# Patient Record
Sex: Female | Born: 1935 | Race: White | Hispanic: No | Marital: Married | Smoking: Never smoker
Health system: Southern US, Community
[De-identification: ages and names within clinical notes are randomized; demographics above are authoritative.]

## PROBLEM LIST (undated history)

## (undated) DIAGNOSIS — I10 Essential (primary) hypertension: Secondary | ICD-10-CM

## (undated) HISTORY — PX: REPLACEMENT TOTAL KNEE BILATERAL: SUR1225

## (undated) HISTORY — PX: CHOLECYSTECTOMY: SHX55

## (undated) HISTORY — PX: ABDOMINAL HYSTERECTOMY: SHX81

## (undated) HISTORY — PX: APPENDECTOMY: SHX54

---

## 2017-01-15 ENCOUNTER — Observation Stay (HOSPITAL_BASED_OUTPATIENT_CLINIC_OR_DEPARTMENT_OTHER)
Admission: EM | Admit: 2017-01-15 | Discharge: 2017-01-16 | Disposition: A | Discharge status: Home / Self Care | Payer: MEDICARE | Attending: Internal Medicine | Admitting: Internal Medicine

## 2017-01-15 ENCOUNTER — Encounter (HOSPITAL_BASED_OUTPATIENT_CLINIC_OR_DEPARTMENT_OTHER): Payer: Self-pay

## 2017-01-15 DIAGNOSIS — R112 Nausea with vomiting, unspecified: Secondary | ICD-10-CM | POA: Diagnosis not present

## 2017-01-15 DIAGNOSIS — R0902 Hypoxemia: Secondary | ICD-10-CM | POA: Diagnosis present

## 2017-01-15 DIAGNOSIS — Z79899 Other long term (current) drug therapy: Secondary | ICD-10-CM | POA: Diagnosis not present

## 2017-01-15 DIAGNOSIS — R1030 Lower abdominal pain, unspecified: Secondary | ICD-10-CM | POA: Diagnosis not present

## 2017-01-15 DIAGNOSIS — I10 Essential (primary) hypertension: Secondary | ICD-10-CM | POA: Diagnosis present

## 2017-01-15 DIAGNOSIS — D72829 Elevated white blood cell count, unspecified: Secondary | ICD-10-CM | POA: Diagnosis present

## 2017-01-15 DIAGNOSIS — F419 Anxiety disorder, unspecified: Secondary | ICD-10-CM | POA: Diagnosis present

## 2017-01-15 DIAGNOSIS — Z7982 Long term (current) use of aspirin: Secondary | ICD-10-CM | POA: Insufficient documentation

## 2017-01-15 DIAGNOSIS — N289 Disorder of kidney and ureter, unspecified: Secondary | ICD-10-CM

## 2017-01-15 DIAGNOSIS — K625 Hemorrhage of anus and rectum: Principal | ICD-10-CM | POA: Diagnosis present

## 2017-01-15 DIAGNOSIS — N390 Urinary tract infection, site not specified: Secondary | ICD-10-CM | POA: Diagnosis present

## 2017-01-15 DIAGNOSIS — K219 Gastro-esophageal reflux disease without esophagitis: Secondary | ICD-10-CM | POA: Diagnosis present

## 2017-01-15 DIAGNOSIS — I499 Cardiac arrhythmia, unspecified: Secondary | ICD-10-CM | POA: Insufficient documentation

## 2017-01-15 HISTORY — DX: Essential (primary) hypertension: I10

## 2017-01-15 LAB — COMPREHENSIVE METABOLIC PANEL
ALBUMIN: 4 g/dL (ref 3.5–5.0)
ALT: 32 U/L (ref 14–54)
ANION GAP: 14 (ref 5–15)
AST: 39 U/L (ref 15–41)
Alkaline Phosphatase: 61 U/L (ref 38–126)
BILIRUBIN TOTAL: 0.9 mg/dL (ref 0.3–1.2)
BUN: 27 mg/dL — ABNORMAL HIGH (ref 6–20)
CALCIUM: 9 mg/dL (ref 8.9–10.3)
CHLORIDE: 98 mmol/L — AB (ref 101–111)
CO2: 23 mmol/L (ref 22–32)
Creatinine, Ser: 1.36 mg/dL — ABNORMAL HIGH (ref 0.44–1.00)
GFR calc Af Amer: 41 mL/min — ABNORMAL LOW (ref >60)
GFR, EST NON AFRICAN AMERICAN: 35 mL/min — AB (ref >60)
GLUCOSE: 138 mg/dL — AB (ref 65–99)
POTASSIUM: 4.6 mmol/L (ref 3.5–5.1)
Sodium: 135 mmol/L (ref 135–145)
TOTAL PROTEIN: 7.7 g/dL (ref 6.5–8.1)

## 2017-01-15 LAB — CBC
HCT: 40.8 % (ref 36.0–46.0)
Hemoglobin: 13.8 g/dL (ref 12.0–15.0)
MCH: 30 pg (ref 26.0–34.0)
MCHC: 33.8 g/dL (ref 30.0–36.0)
MCV: 88.7 fL (ref 78.0–100.0)
PLATELETS: 242 10*3/uL (ref 150–400)
RBC: 4.6 MIL/uL (ref 3.87–5.11)
RDW: 13.7 % (ref 11.5–15.5)
WBC: 12.4 10*3/uL — ABNORMAL HIGH (ref 4.0–10.5)

## 2017-01-15 MED ORDER — LORAZEPAM 1 MG PO TABS
1.0000 mg | ORAL_TABLET | Freq: Once | ORAL | Status: AC
  Administered 2017-01-15: 1 mg via ORAL
  Filled 2017-01-15: qty 1

## 2017-01-15 NOTE — ED Provider Notes (Signed)
[facility] Provider Note   CSN: [id number] Arrival date & time: 01/15/17  1926  By signing my name below, I, Nelwyn Salisbury, attest that this documentation has been prepared under the direction and in the presence of Rolan Bucco, MD . Electronically Signed: Nelwyn Salisbury, Scribe. 01/15/2017. 8:38 PM.  History   Chief Complaint Chief Complaint  Patient presents with  . Rectal Bleeding   The history is provided by the patient. No language interpreter was used.     HPI Comments: Vanessa Levine is a 83 y.o. female with pmhx of HTN who presents to the Emergency Department complaining of sudden-onset, constant rectal bleeding beginning this morning. She describes her rectal bleeding as occurring 4-6 times today. Pt reports associated nausea, vomiting, abdominal cramping and fatigue. She has not tried anything PTA for her symptoms. Denies any dizziness, syncope, or rectal pain. Pt has been compliant with a daily baby aspirin as her only blood-thinning medication. She has a previous abdominal shx of cholecystectomy, appendectomy and hysterectomy.   Past Medical History:  Diagnosis Date  . Hypertension     Patient Active Problem List   Diagnosis Date Noted  . Rectal bleeding 01/15/2017    Past Surgical History:  Procedure Laterality Date  . ABDOMINAL HYSTERECTOMY    . APPENDECTOMY    . CHOLECYSTECTOMY    . REPLACEMENT TOTAL KNEE BILATERAL      OB History    No data available       Home Medications    Prior to Admission medications   Not on File    Family History No family history on file.  Social History Social History  Substance Use Topics  . Smoking status: Not on file  . Smokeless tobacco: Never Used  . Alcohol use No     Allergies   Patient has no known allergies.   Review of Systems Review of Systems  Constitutional: Positive for fatigue. Negative for chills, diaphoresis and fever.  HENT: Negative for congestion, rhinorrhea and sneezing.     Eyes: Negative.   Respiratory: Negative for cough, chest tightness and shortness of breath.   Cardiovascular: Negative for chest pain and leg swelling.  Gastrointestinal: Positive for anal bleeding, nausea and vomiting. Negative for abdominal pain, blood in stool, diarrhea and rectal pain.  Genitourinary: Negative for difficulty urinating, flank pain, frequency and hematuria.  Musculoskeletal: Negative for arthralgias and back pain.  Skin: Negative for rash.  Neurological: Negative for dizziness, speech difficulty, weakness, numbness and headaches.     Physical Exam Updated Vital Signs BP (!) 142/75 (BP Location: Right Arm)   Pulse 64   Temp 98.4 F (36.9 C) (Oral)   Resp 18   Ht  (1.702 m)   Wt 260 lb (117.9 kg)   SpO2 92%   BMI 40.72 kg/m   Physical Exam  Constitutional: She is oriented to person, place, and time. She appears well-developed and well-nourished.  HENT:  Head: Normocephalic and atraumatic.  Eyes: Pupils are equal, round, and reactive to light.  Neck: Normal range of motion. Neck supple.  Cardiovascular: Normal rate, regular rhythm and normal heart sounds.   Pulmonary/Chest: Effort normal and breath sounds normal. No respiratory distress. She has no wheezes. She has no rales. She exhibits no tenderness.  Abdominal: Soft. Bowel sounds are normal. There is no tenderness. There is no rebound and no guarding.  Genitourinary:  Genitourinary Comments: Large amount of blood in patient's diaper. No obvious source for bleeding.   Musculoskeletal: Normal range  of motion. She exhibits no edema.  Lymphadenopathy:    She has no cervical adenopathy.  Neurological: She is alert and oriented to person, place, and time.  Skin: Skin is warm and dry. No rash noted.  Psychiatric: She has a normal mood and affect.     ED Treatments / Results  DIAGNOSTIC STUDIES:  Oxygen Saturation is 96% on RA, normal by my interpretation.    COORDINATION OF CARE:  8:52 PM  Discussed treatment plan with pt at bedside which includes admission and pt is hesitant about agreeing to admission.   9:31 PM Spoke with patient again about admission to the hospital and she agreed to the plan.   10:13 PM Consulted with Dr. Antionette Char, Hospitalist, about pt admission to [facility].  Labs (all labs ordered are listed, but only abnormal results are displayed) Labs Reviewed  COMPREHENSIVE METABOLIC PANEL - Abnormal; Notable for the following:       Result Value   Chloride 98 (*)    Glucose, Bld 138 (*)    BUN 27 (*)    Creatinine, Ser 1.36 (*)    GFR calc non Af Amer 35 (*)    GFR calc Af Amer 41 (*)    All other components within normal limits  CBC - Abnormal; Notable for the following:    WBC 12.4 (*)    All other components within normal limits    EKG  EKG Interpretation None       Radiology No results found.  Procedures Procedures (including critical care time)  Medications Ordered in ED Medications  LORazepam (ATIVAN) tablet 1 mg (1 mg Oral Given 01/15/17 2113)     Initial Impression / Assessment and Plan / ED Course  I have reviewed the triage vital signs and the nursing notes.  Pertinent labs & imaging results that were available during my care of the patient were reviewed by me and considered in my medical decision making (see chart for details).     PT with rectal bleeding, no abd pain.  No indication for CT.  BP stable, Hgb stable.  Will admit to [facility].  Final Clinical Impressions(s) / ED Diagnoses   Final diagnoses:  Rectal bleeding    New Prescriptions New Prescriptions   No medications on file  I personally performed the services described in this documentation, which was scribed in my presence.  The recorded information has been reviewed and considered.     Rolan Bucco, MD 01/15/17 2223

## 2017-01-15 NOTE — Plan of Care (Signed)
Accepted to telemetry/observation for hematochezia. She will need serial H&H measurements and GI evaluation in the morning.  83 year old female with a past medical history hypertension who presented to the emergency department at [facility] complains of abdominal pain, nausea, 1 episode of emesis and 6 episodes of bloody diarrhea since earlier today. Not on aspirin for anticoagulation.  Per Dr. Fredderick Phenix,  Chief Complaint    Chief Complaint  Patient presents with  . Rectal Bleeding   The history is provided by the patient. No language interpreter was used.     HPI Comments: Vanessa Levine is a 83 y.o. female with pmhx of HTN who presents to the Emergency Department complaining of sudden-onset, constant rectal bleeding beginning this morning. She describes her rectal bleeding as occurring 4-6 times today. Pt reports associated nausea, vomiting, abdominal cramping and fatigue. She has not tried anything PTA for her symptoms. Denies any dizziness, syncope, or rectal pain. Pt has been compliant with a daily baby aspirin as her only blood-thinning medication. She has a previous abdominal shx of cholecystectomy, appendectomy and hysterectomy.     Comprehensive metabolic panel [[id number]] (Abnormal) Collected: 01/15/17 1956  Updated: 01/15/17 2022   Specimen Type: Blood   Specimen Source: Vein    Sodium 135 mmol/L   Potassium 4.6 mmol/L   Chloride 98 (L) mmol/L   CO2 23 mmol/L   Glucose, Bld 138 (H) mg/dL   BUN 27 (H) mg/dL   Creatinine, Ser 7.82 (H) mg/dL   Calcium 9.0 mg/dL   Total Protein 7.7 g/dL   Albumin 4.0 g/dL   AST 39 U/L   ALT 32 U/L   Alkaline Phosphatase 61 U/L   Total Bilirubin 0.9 mg/dL   GFR calc non Af Amer 35 (L) mL/min   GFR calc Af Amer 41 (L) mL/min   Anion gap 14  CBC [[id number]] (Abnormal) Collected: 01/15/17 1956  Updated: 01/15/17 2007   Specimen Type: Blood   Specimen Source: Vein    WBC 12.4 (H) K/uL   RBC 4.60 MIL/uL   Hemoglobin 13.8 g/dL   HCT 57.8 %   MCV 46.9 fL   MCH 30.0 pg   MCHC 33.8 g/dL   RDW 62.9 %   Platelets 242 K/uL   Sanda Klein, M.D. Pager [phone]

## 2017-01-15 NOTE — ED Notes (Signed)
PT presents to ED with c/o of abdominal pain and bloody diarrhea X at least 6 per husband and vomiting X 1 today.

## 2017-01-15 NOTE — ED Notes (Signed)
Pt up to bedside potty chair with assistance .

## 2017-01-16 ENCOUNTER — Observation Stay (HOSPITAL_COMMUNITY): Payer: MEDICARE

## 2017-01-16 ENCOUNTER — Other Ambulatory Visit: Payer: Self-pay

## 2017-01-16 ENCOUNTER — Encounter (HOSPITAL_COMMUNITY): Payer: Self-pay

## 2017-01-16 DIAGNOSIS — D72829 Elevated white blood cell count, unspecified: Secondary | ICD-10-CM | POA: Diagnosis present

## 2017-01-16 DIAGNOSIS — N390 Urinary tract infection, site not specified: Secondary | ICD-10-CM

## 2017-01-16 DIAGNOSIS — K219 Gastro-esophageal reflux disease without esophagitis: Secondary | ICD-10-CM

## 2017-01-16 DIAGNOSIS — N289 Disorder of kidney and ureter, unspecified: Secondary | ICD-10-CM

## 2017-01-16 DIAGNOSIS — I10 Essential (primary) hypertension: Secondary | ICD-10-CM | POA: Diagnosis present

## 2017-01-16 DIAGNOSIS — I499 Cardiac arrhythmia, unspecified: Secondary | ICD-10-CM

## 2017-01-16 DIAGNOSIS — R1030 Lower abdominal pain, unspecified: Secondary | ICD-10-CM | POA: Diagnosis not present

## 2017-01-16 DIAGNOSIS — F419 Anxiety disorder, unspecified: Secondary | ICD-10-CM | POA: Diagnosis present

## 2017-01-16 DIAGNOSIS — K625 Hemorrhage of anus and rectum: Secondary | ICD-10-CM

## 2017-01-16 DIAGNOSIS — R0902 Hypoxemia: Secondary | ICD-10-CM | POA: Diagnosis present

## 2017-01-16 DIAGNOSIS — R112 Nausea with vomiting, unspecified: Secondary | ICD-10-CM | POA: Diagnosis not present

## 2017-01-16 LAB — BASIC METABOLIC PANEL
Anion gap: 8 (ref 5–15)
BUN: 24 mg/dL — AB (ref 6–20)
CHLORIDE: 103 mmol/L (ref 101–111)
CO2: 29 mmol/L (ref 22–32)
Calcium: 8.5 mg/dL — ABNORMAL LOW (ref 8.9–10.3)
Creatinine, Ser: 1.12 mg/dL — ABNORMAL HIGH (ref 0.44–1.00)
GFR calc Af Amer: 52 mL/min — ABNORMAL LOW (ref >60)
GFR calc non Af Amer: 44 mL/min — ABNORMAL LOW (ref >60)
GLUCOSE: 116 mg/dL — AB (ref 65–99)
POTASSIUM: 4.2 mmol/L (ref 3.5–5.1)
Sodium: 140 mmol/L (ref 135–145)

## 2017-01-16 LAB — CBC WITH DIFFERENTIAL/PLATELET
BASOS ABS: 0 10*3/uL (ref 0.0–0.1)
Basophils Relative: 1 %
EOS PCT: 2 %
Eosinophils Absolute: 0.1 10*3/uL (ref 0.0–0.7)
HCT: 36.5 % (ref 36.0–46.0)
HEMOGLOBIN: 12 g/dL (ref 12.0–15.0)
LYMPHS PCT: 28 %
Lymphs Abs: 2.3 10*3/uL (ref 0.7–4.0)
MCH: 29.6 pg (ref 26.0–34.0)
MCHC: 32.9 g/dL (ref 30.0–36.0)
MCV: 89.9 fL (ref 78.0–100.0)
Monocytes Absolute: 0.7 10*3/uL (ref 0.1–1.0)
Monocytes Relative: 9 %
NEUTROS PCT: 60 %
Neutro Abs: 5 10*3/uL (ref 1.7–7.7)
PLATELETS: 214 10*3/uL (ref 150–400)
RBC: 4.06 MIL/uL (ref 3.87–5.11)
RDW: 13.8 % (ref 11.5–15.5)
WBC: 8.2 10*3/uL (ref 4.0–10.5)

## 2017-01-16 LAB — HEMOGLOBIN AND HEMATOCRIT, BLOOD
HCT: 38.4 % (ref 36.0–46.0)
HEMATOCRIT: 39.7 % (ref 36.0–46.0)
HEMOGLOBIN: 12.5 g/dL (ref 12.0–15.0)
Hemoglobin: 13.2 g/dL (ref 12.0–15.0)

## 2017-01-16 LAB — ABO/RH: ABO/RH(D): A POS

## 2017-01-16 LAB — URINALYSIS, ROUTINE W REFLEX MICROSCOPIC
Bilirubin Urine: NEGATIVE
Glucose, UA: NEGATIVE mg/dL
Ketones, ur: NEGATIVE mg/dL
NITRITE: NEGATIVE
PROTEIN: NEGATIVE mg/dL
Specific Gravity, Urine: 1.015 (ref 1.005–1.030)
pH: 5 (ref 5.0–8.0)

## 2017-01-16 LAB — TYPE AND SCREEN
ABO/RH(D): A POS
ANTIBODY SCREEN: NEGATIVE

## 2017-01-16 LAB — MAGNESIUM: Magnesium: 1.9 mg/dL (ref 1.7–2.4)

## 2017-01-16 LAB — TROPONIN I

## 2017-01-16 LAB — TSH: TSH: 1.247 u[IU]/mL (ref 0.350–4.500)

## 2017-01-16 MED ORDER — SODIUM CHLORIDE 0.9 % IV BOLUS (SEPSIS)
250.0000 mL | Freq: Once | INTRAVENOUS | Status: AC
  Administered 2017-01-16: 250 mL via INTRAVENOUS

## 2017-01-16 MED ORDER — ACETAMINOPHEN 325 MG PO TABS: 650.0000 mg | ORAL_TABLET | Freq: Four times a day (QID) | ORAL | Status: DC | PRN

## 2017-01-16 MED ORDER — MORPHINE SULFATE (PF) 4 MG/ML IV SOLN
INTRAVENOUS | Status: AC
  Administered 2017-01-16: 2 mg via INTRAVENOUS
  Filled 2017-01-16: qty 1

## 2017-01-16 MED ORDER — PANTOPRAZOLE SODIUM 40 MG PO TBEC
40.0000 mg | DELAYED_RELEASE_TABLET | Freq: Two times a day (BID) | ORAL | Status: DC
  Administered 2017-01-16 (×2): 40 mg via ORAL
  Filled 2017-01-16 (×2): qty 1

## 2017-01-16 MED ORDER — GABAPENTIN 100 MG PO CAPS
100.0000 mg | ORAL_CAPSULE | Freq: Every day | ORAL | Status: DC | PRN
  Filled 2017-01-16: qty 1

## 2017-01-16 MED ORDER — ONDANSETRON HCL 4 MG/2ML IJ SOLN
4.0000 mg | Freq: Once | INTRAMUSCULAR | Status: AC
  Administered 2017-01-16: 4 mg via INTRAVENOUS
  Filled 2017-01-16: qty 2

## 2017-01-16 MED ORDER — ONDANSETRON HCL 4 MG PO TABS: 4.0000 mg | ORAL_TABLET | Freq: Four times a day (QID) | ORAL | Status: DC | PRN

## 2017-01-16 MED ORDER — ASPIRIN 81 MG PO CHEW: 81.0000 mg | CHEWABLE_TABLET | Freq: Every day | ORAL | Status: AC

## 2017-01-16 MED ORDER — ONDANSETRON HCL 4 MG/2ML IJ SOLN: 4.0000 mg | Freq: Four times a day (QID) | INTRAMUSCULAR | Status: DC | PRN

## 2017-01-16 MED ORDER — LORAZEPAM 1 MG PO TABS: 1.0000 mg | ORAL_TABLET | ORAL | Status: DC | PRN

## 2017-01-16 MED ORDER — AMLODIPINE BESYLATE 10 MG PO TABS
10.0000 mg | ORAL_TABLET | Freq: Every day | ORAL | Status: DC
Start: 2017-01-16 — End: 2017-01-16
  Administered 2017-01-16: 10 mg via ORAL
  Filled 2017-01-16: qty 1

## 2017-01-16 MED ORDER — GABAPENTIN 100 MG PO CAPS
100.0000 mg | ORAL_CAPSULE | Freq: Once | ORAL | Status: AC
  Administered 2017-01-16: 100 mg via ORAL

## 2017-01-16 MED ORDER — MONTELUKAST SODIUM 10 MG PO TABS
10.0000 mg | ORAL_TABLET | Freq: Every day | ORAL | Status: DC
  Administered 2017-01-16: 10 mg via ORAL
  Filled 2017-01-16: qty 1

## 2017-01-16 MED ORDER — SODIUM CHLORIDE 0.9 % IV SOLN
INTRAVENOUS | Status: DC
  Administered 2017-01-16: 03:00:00 via INTRAVENOUS

## 2017-01-16 MED ORDER — CEFTRIAXONE SODIUM 2 G IJ SOLR
2.0000 g | Freq: Every day | INTRAMUSCULAR | Status: DC
  Administered 2017-01-16: 2 g via INTRAVENOUS
  Filled 2017-01-16: qty 2

## 2017-01-16 MED ORDER — ACETAMINOPHEN 650 MG RE SUPP: 650.0000 mg | Freq: Four times a day (QID) | RECTAL | Status: DC | PRN

## 2017-01-16 MED ORDER — LORAZEPAM 1 MG PO TABS: 1.0000 mg | ORAL_TABLET | Freq: Two times a day (BID) | ORAL | Status: DC | PRN

## 2017-01-16 MED ORDER — TRAMADOL HCL 50 MG PO TABS: 50.0000 mg | ORAL_TABLET | Freq: Two times a day (BID) | ORAL | Status: DC | PRN

## 2017-01-16 MED ORDER — MORPHINE SULFATE (PF) 4 MG/ML IV SOLN
2.0000 mg | Freq: Once | INTRAVENOUS | Status: AC
  Administered 2017-01-16: 2 mg via INTRAVENOUS

## 2017-01-16 MED ORDER — ALBUTEROL SULFATE (2.5 MG/3ML) 0.083% IN NEBU: 2.5000 mg | INHALATION_SOLUTION | RESPIRATORY_TRACT | Status: DC | PRN

## 2017-01-16 MED ORDER — SODIUM CHLORIDE 0.9 % IV SOLN: Freq: Once | INTRAVENOUS | Status: DC

## 2017-01-16 MED ORDER — LORATADINE 10 MG PO TABS
10.0000 mg | ORAL_TABLET | Freq: Every day | ORAL | Status: DC
  Administered 2017-01-16: 10 mg via ORAL
  Filled 2017-01-16: qty 1

## 2017-01-16 NOTE — Progress Notes (Addendum)
Patient had run junctional tachycardia w/ HR to 140s while lying in the bed. Patient has no c/o pain but doesn't seem like herself. Notified MD. Continue to monitor. New orders placed in EPIC.

## 2017-01-16 NOTE — Discharge Instructions (Signed)
Follow with Primary MD PCP in 3-4 days   Get CBC, CMP,  checked  by Primary MD next visit.    Activity: As tolerated with Full fall precautions use walker/cane & assistance as needed   Disposition Home   Diet: Heart Healthy , with feeding assistance and aspiration precautions.   On your next visit with your primary care physician please Get Medicines reviewed and adjusted.   Please request your Prim.MD to go over all Hospital Tests and Procedure/Radiological results at the follow up, please get all Hospital records sent to your Prim MD by signing hospital release before you go home.   If you experience worsening of your admission symptoms, develop shortness of breath, life threatening emergency, suicidal or homicidal thoughts you must seek medical attention immediately by calling 911 or calling your MD immediately  if symptoms less severe.  You Must read complete instructions/literature along with all the possible adverse reactions/side effects for all the Medicines you take and that have been prescribed to you. Take any new Medicines after you have completely understood and accpet all the possible adverse reactions/side effects.   Do not drive, operating heavy machinery, perform activities at heights, swimming or participation in water activities or provide baby sitting services if your were admitted for syncope or siezures until you have seen by Primary MD or a Neurologist and advised to do so again.  Do not drive when taking Pain medications.    Do not take more than prescribed Pain, Sleep and Anxiety Medications  Special Instructions: If you have smoked or chewed Tobacco  in the last 2 yrs please stop smoking, stop any regular Alcohol  and or any Recreational drug use.  Wear Seat belts while driving.   Please note  You were cared for by a hospitalist during your hospital stay. If you have any questions about your discharge medications or the care you received while you were  in the hospital after you are discharged, you can call the unit and asked to speak with the hospitalist on call if the hospitalist that took care of you is not available. Once you are discharged, your primary care physician will handle any further medical issues. Please note that NO REFILLS for any discharge medications will be authorized once you are discharged, as it is imperative that you return to your primary care physician (or establish a relationship with a primary care physician if you do not have one) for your aftercare needs so that they can reassess your need for medications and monitor your lab values.

## 2017-01-16 NOTE — H&P (Signed)
History and Physical    Vanessa Levine ZOX:[id number] DOB: 1932/11/18 DOA: 01/15/2017  Referring MD/NP/PA: Dr. Robb Matar PCP: Pcp Not In System  Patient coming from: Transfer from [facility]  Chief Complaint: Rectal bleeding  HPI: Vanessa Levine is a 83 y.o. female with medical history significant of hypertension and seasonal allergies; who presents with complaints of acute onset of rectal bleeding yesterday morning. Patient is currently visiting family from [place]. While out shopping yesterday morning when family patient had acute onset of rectal bleeding noting 6-12 episodes of bright red blood per rectum with runny bowel movements. Associated symptoms included nausea, vomiting(2 episodes of nonbloody emesis), and lower abdominal pain. The pain in her lower abdomen was like a constipation type pain that resolved with bowel movements. Denies any significant chest pain, shortness of breath, fever, chills, dysuria, urinary frequency, lightheadedness, loss of consciousness, recent antibiotics, or history of hemorrhoids. She has never had similar symptoms like this in the past. Patient is on a baby aspirin daily and denies any regular NSAID use. Patient notes previous abdominal surgical history of cholecystectomy, appendectomy, and hysterectomy.  ED Course: Upon admission to the emergency department patient was noted have vital signs relatively within normal limits. Labs revealed WBC 12.4, hemoglobin 13.8, BUN 27, creatinine 1.36. Since initial onset of symptoms patient now bleeding symptoms have significantly decreased.  Review of Systems: As per HPI otherwise 10 point review of systems negative.   Past Medical History:  Diagnosis Date  . Hypertension     Past Surgical History:  Procedure Laterality Date  . ABDOMINAL HYSTERECTOMY    . APPENDECTOMY    . CHOLECYSTECTOMY    . REPLACEMENT TOTAL KNEE BILATERAL       does not have a smoking history on file. She has never used smokeless tobacco. She  reports that she does not drink alcohol. Her drug history is not on file.  No Known Allergies  No family history on file.  Prior to Admission medications   Medication Sig Start Date End Date Taking? Authorizing Provider  amLODipine (NORVASC) 10 MG tablet Take 10 mg by mouth daily.   Yes Historical Provider, MD  aspirin 81 MG chewable tablet Chew 81 mg by mouth daily.   Yes Historical Provider, MD  calcium citrate-vitamin D (CITRACAL+D) 315-200 MG-UNIT tablet Take 1 tablet by mouth 2 (two) times daily.   Yes Historical Provider, MD  fexofenadine (ALLEGRA) 30 MG tablet Take 30 mg by mouth as needed.   Yes Historical Provider, MD  gabapentin (NEURONTIN) 100 MG capsule Take 100 mg by mouth as needed.   Yes Historical Provider, MD  lisinopril (PRINIVIL,ZESTRIL) 10 MG tablet Take 10 mg by mouth daily.   Yes Historical Provider, MD  LORazepam (ATIVAN) 1 MG tablet Take 1 mg by mouth as needed for anxiety.   Yes Historical Provider, MD  montelukast (SINGULAIR) 10 MG tablet Take 10 mg by mouth at bedtime.   Yes Historical Provider, MD  Multiple Vitamin (MULTIVITAMIN) tablet Take 1 tablet by mouth daily.   Yes Historical Provider, MD  omeprazole (PRILOSEC) 40 MG capsule Take 40 mg by mouth daily.   Yes Historical Provider, MD  traMADol (ULTRAM) 50 MG tablet Take by mouth as needed.   Yes Historical Provider, MD    Physical Exam:    Constitutional: Obese elderly female in NAD, calm, comfortable Vitals:   01/15/17 2200 01/15/17 2230 01/15/17 2300 01/16/17 0117  BP: (!) 113/59 132/89 127/81 125/63  Pulse: 69 71 67 73  Resp: 18 (!) 22  18 20  Temp:    99.1 F (37.3 C)  TempSrc:    Oral  SpO2: 92% 92% 91% (!) 89%  Weight:    121.5 kg (267 lb 13.7 oz)  Height:       Eyes: PERRL, lids and conjunctivae normal ENMT: Mucous membranes are dry. Posterior pharynx clear of any exudate or lesions. Normal dentition.  Neck: normal, supple, no masses, no thyromegaly Respiratory: clear to auscultation  bilaterally, no wheezing, no crackles. Normal respiratory effort. No accessory muscle use.  Cardiovascular: Regular rate and rhythm, no murmurs / rubs / gallops. No extremity edema. 2+ pedal pulses. No carotid bruits.  Abdomen: no tenderness, no masses palpated. No hepatosplenomegaly. Bowel sounds positive.  Small amount of bright red blood noted in toilet bowl. Musculoskeletal: no clubbing / cyanosis. No joint deformity upper and lower extremities. Good ROM, no contractures. Normal muscle tone.  Skin: no rashes, lesions, ulcers. No induration Neurologic: CN 2-12 grossly intact. Sensation intact, DTR normal. Strength 5/5 in all 4.  Psychiatric: Normal judgment and insight. Alert and oriented x 3. Normal mood.     Labs on Admission: I have personally reviewed following labs and imaging studies  CBC:  Recent Labs Lab 01/15/17 1956  WBC 12.4*  HGB 13.8  HCT 40.8  MCV 88.7  PLT 242   Basic Metabolic Panel:  Recent Labs Lab 01/15/17 1956  NA 135  K 4.6  CL 98*  CO2 23  GLUCOSE 138*  BUN 27*  CREATININE 1.36*  CALCIUM 9.0   GFR: Estimated Creatinine Clearance: 43.1 mL/min (A) (by C-G formula based on SCr of 1.36 mg/dL (H)). Liver Function Tests:  Recent Labs Lab 01/15/17 1956  AST 39  ALT 32  ALKPHOS 61  BILITOT 0.9  PROT 7.7  ALBUMIN 4.0   No results for input(s): LIPASE, AMYLASE in the last 168 hours. No results for input(s): AMMONIA in the last 168 hours. Coagulation Profile: No results for input(s): INR, PROTIME in the last 168 hours. Cardiac Enzymes: No results for input(s): CKTOTAL, CKMB, CKMBINDEX, TROPONINI in the last 168 hours. BNP (last 3 results) No results for input(s): PROBNP in the last 8760 hours. HbA1C: No results for input(s): HGBA1C in the last 72 hours. CBG: No results for input(s): GLUCAP in the last 168 hours. Lipid Profile: No results for input(s): CHOL, HDL, LDLCALC, TRIG, CHOLHDL, LDLDIRECT in the last 72 hours. Thyroid Function  Tests: No results for input(s): TSH, T4TOTAL, FREET4, T3FREE, THYROIDAB in the last 72 hours. Anemia Panel: No results for input(s): VITAMINB12, FOLATE, FERRITIN, TIBC, IRON, RETICCTPCT in the last 72 hours. Urine analysis: No results found for: COLORURINE, APPEARANCEUR, LABSPEC, PHURINE, GLUCOSEU, HGBUR, BILIRUBINUR, KETONESUR, PROTEINUR, UROBILINOGEN, NITRITE, LEUKOCYTESUR Sepsis Labs: No results found for this or any previous visit (from the past 240 hour(s)).   Radiological Exams on Admission: No results found.    Assessment/Plan Rectal bleeding: Acute. Patient reports multiple episodes of rectal bleeding since this a.m. No previous history. Differential includes a diverticular bleed vs upper with elevated BUN. - Admit to a med-surg - Clear liquid diet - Serial Monitor H&H - T&S for possible need of blood products - May warrant consult GI in a.m.    Nausea, vomiting, and abdominal pain: Patient reports now resolved. - Zofran IV prn - Check acute abdominal series - may warrant CT scan if lower abdominal pain symptoms persist  Leukocytosis: WBC elevated at 12.4. Question if symptoms secondary to above. - Check urinalysis, will check urine culture and treat  for signs of infection  Renal insufficiency: Patient presents with creatinine 1.36 and BUN 27. Baseline currently unknown. Suspect that this could be secondary to dehydration given elevated BUN to creatinine ratio. Question if this is a acute kidney injury. - IV fluids normal saline bolus of 250 ml then start rate at 75 ml/hr - Recheck BMP later in a.m.  Essential hypertension - Hold lisinopril - Continue amlodipine  Addendum: Hypoxia and arrhythmia: Patient noted to have initial O2 saturations as low as 89 percent. Overnight patient noted to have O2 sat drop into the 70s with ambulation with diffuse wheezes. Telemetry abnormalities of accelerated rated rhythm to the 140s with intermittent PVCs. Patient denies any symptoms  of shortness of breath or chest pain. - Continuous pulse oximetry with [place] O2 to keep O2 sats greater than 92% - DuoNeb prn SOB - Checking troponin and electrolytes  - Follow-up telemetry consider need of consultative cardiology and/or echo  Anxiety - Continue Ativan prn  Seasonal allergies - Continue Singulair and pharmacy substitution of Claritin  Gerd  - Pharmacy substitution of Protonix   DVT prophylaxis: scd   Code Status: full  Family Communication:  no family present at bedside  Disposition Plan: Likely discharge home if bleeding stops. ts called: None Admission status: observation  Clydie Braun MD [facility] Pager [phone]  If 7PM-7AM, please contact night-coverage [email or web address] Password [email or web address]  01/16/2017, 1:21 AM

## 2017-01-16 NOTE — Progress Notes (Signed)
PHARMACY NOTE -  ANTIBIOTIC RENAL DOSE ADJUSTMENT   Request received for Pharmacy to assist with antibiotic renal dose adjustment.  Patient has been initiated on Ceftriaxone 2gm iv q24hr   for UTI. SCr 1.36, estimated CrCl 43 ml/min Current dosage is appropriate and need for further dosage adjustment appears unlikely at present. Will sign off at this time.  Please reconsult if a change in clinical status warrants re-evaluation of dosage.

## 2017-01-16 NOTE — Discharge Summary (Signed)
Vanessa Levine, is a 83 y.o. female  DOB [birth year]  MRN [id number].  Admission date:  01/15/2017  Admitting Physician  Clydie Braun, MD  Discharge Date:  01/16/2017   Primary MD  Pcp Not In System  Recommendations for primary care physician for things to follow:  - Check CBC, BMP during next visit   Admission Diagnosis  Rectal bleeding [K62.5]   Discharge Diagnosis  Rectal bleeding [K62.5]    Principal Problem:   Rectal bleeding Active Problems:   Leukocytosis   Acute lower UTI   Essential hypertension   Hypoxia   Anxiety   GERD (gastroesophageal reflux disease)   Renal insufficiency      Past Medical History:  Diagnosis Date  . Hypertension     Past Surgical History:  Procedure Laterality Date  . ABDOMINAL HYSTERECTOMY    . APPENDECTOMY    . CHOLECYSTECTOMY    . REPLACEMENT TOTAL KNEE BILATERAL         History of present illness and  Hospital Course:     Kindly see H&P for history of present illness and admission details, please review complete Labs, Consult reports and Test reports for all details in brief  HPI  from the history and physical done on the day of admission 01/16/2017  HPI: Vanessa Levine is a 83 y.o. female with medical history significant of hypertension and seasonal allergies; who presents with complaints of acute onset of rectal bleeding yesterday morning. Patient is currently visiting family from [place]. While out shopping yesterday morning when family patient had acute onset of rectal bleeding noting 6-12 episodes of bright red blood per rectum with runny bowel movements. Associated symptoms included nausea, vomiting(2 episodes of nonbloody emesis), and lower abdominal pain. The pain in her lower abdomen was like a constipation type pain that resolved with bowel movements. Denies any significant chest pain, shortness of breath, fever, chills, dysuria,  urinary frequency, lightheadedness, loss of consciousness, recent antibiotics, or history of hemorrhoids. She has never had similar symptoms like this in the past. Patient is on a baby aspirin daily and denies any regular NSAID use. Patient notes previous abdominal surgical history of cholecystectomy, appendectomy, and hysterectomy.  ED Course: Upon admission to the emergency department patient was noted have vital signs relatively within normal limits. Labs revealed WBC 12.4, hemoglobin 13.8, BUN 27, creatinine 1.36. Since initial onset of symptoms patient now bleeding symptoms have significantly decreased  Hospital Course   Rectal bleeding -  Acute. Patient reports multiple episodes of rectal bleeding since this a.m. No previous history. reorts most recent colonoscopy before 2 years with no acute findings , patient was admitted for observation overnight, there was no recurrent episodes of rectal bleeding, there was normal color stool on my rectal exam with evidence of external hemorrhoids, he denies any abdominal pain, nausea or vomiting, tolerating diet, repeat hemoglobin has been stable, and is eager for discharge today, she can be discharged today, and to follow-up with PCP next week for repeat CBC, she was instructed to hold  her aspirin for next 72 hours, and to come back to ED if she had recurrence of her bleeding.\  Nausea, vomiting, and abdominal pain: -  Patient reports symptoms now resolved, advanced to heart healthy diet to where she has been tolerating very well, he is afebrile with no leukocytosis,.    WBC elevated at 12.4.  - Positive urinalysis, but it appears to be contaminated species, she has no urinary symptoms to indicate UTI, so we'll discontinue antibiotics  Renal insufficiency: - And 1.3 on admission, improved with IV fluids, it is 1.1 on discharge  Essential hypertension - Resume home medication on discharge  Questionable hypoxia - chest X-ray with no acute  findings, pulse ox is being checked patient artificial nail, oxygen saturation 100% on room air while checked on direct skin, she is ambulating with no dyspnea.  Anxiety - Continue Ativan prn  Seasonal allergies - Continue home meds  Gerd  - cont PPI    Discharge Condition:  stable   Follow UP She was instructed to follow with PCP next week     Discharge Instructions  and  Discharge Medications    Discharge Instructions    Discharge instructions    Complete by:  As directed    Follow with Primary MD PCP in 3-4 days   Get CBC, CMP,  checked  by Primary MD next visit.    Activity: As tolerated with Full fall precautions use walker/cane & assistance as needed   Disposition Home   Diet: Heart Healthy , with feeding assistance and aspiration precautions.   On your next visit with your primary care physician please Get Medicines reviewed and adjusted.   Please request your Prim.MD to go over all Hospital Tests and Procedure/Radiological results at the follow up, please get all Hospital records sent to your Prim MD by signing hospital release before you go home.   If you experience worsening of your admission symptoms, develop shortness of breath, life threatening emergency, suicidal or homicidal thoughts you must seek medical attention immediately by calling 911 or calling your MD immediately  if symptoms less severe.  You Must read complete instructions/literature along with all the possible adverse reactions/side effects for all the Medicines you take and that have been prescribed to you. Take any new Medicines after you have completely understood and accpet all the possible adverse reactions/side effects.   Do not drive, operating heavy machinery, perform activities at heights, swimming or participation in water activities or provide baby sitting services if your were admitted for syncope or siezures until you have seen by Primary MD or a Neurologist and advised to  do so again.  Do not drive when taking Pain medications.    Do not take more than prescribed Pain, Sleep and Anxiety Medications  Special Instructions: If you have smoked or chewed Tobacco  in the last 2 yrs please stop smoking, stop any regular Alcohol  and or any Recreational drug use.  Wear Seat belts while driving.   Please note  You were cared for by a hospitalist during your hospital stay. If you have any questions about your discharge medications or the care you received while you were in the hospital after you are discharged, you can call the unit and asked to speak with the hospitalist on call if the hospitalist that took care of you is not available. Once you are discharged, your primary care physician will handle any further medical issues. Please note that NO REFILLS for any discharge medications will  be authorized once you are discharged, as it is imperative that you return to your primary care physician (or establish a relationship with a primary care physician if you do not have one) for your aftercare needs so that they can reassess your need for medications and monitor your lab values.   Increase activity slowly    Complete by:  As directed      Allergies as of 01/16/2017   No Known Allergies     Medication List    TAKE these medications   amLODipine 10 MG tablet Commonly known as:  NORVASC Take 10 mg by mouth daily.   aspirin 81 MG chewable tablet Chew 1 tablet (81 mg total) by mouth daily. Please hold for next 72 hours Start taking on:  01/19/2017 What changed:  additional instructions   calcium citrate-vitamin D 315-200 MG-UNIT tablet Commonly known as:  CITRACAL+D Take 1 tablet by mouth 2 (two) times daily.   fexofenadine 30 MG tablet Commonly known as:  ALLEGRA Take 30 mg by mouth as needed.   gabapentin 100 MG capsule Commonly known as:  NEURONTIN Take 100 mg by mouth as needed.   lisinopril 10 MG tablet Commonly known as:  PRINIVIL,ZESTRIL Take  10 mg by mouth daily.   LORazepam 1 MG tablet Commonly known as:  ATIVAN Take 1 mg by mouth as needed for anxiety.   montelukast 10 MG tablet Commonly known as:  SINGULAIR Take 10 mg by mouth at bedtime.   multivitamin tablet Take 1 tablet by mouth daily.   omeprazole 40 MG capsule Commonly known as:  PRILOSEC Take 40 mg by mouth daily.   traMADol 50 MG tablet Commonly known as:  ULTRAM Take by mouth as needed.         Diet and Activity recommendation: See Discharge Instructions above   Consults obtained -  None   Major procedures and Radiology Reports - PLEASE review detailed and final reports for all details, in brief -      Dg Chest 2 View  Result Date: 01/16/2017 CLINICAL DATA:  Hypoxia EXAM: CHEST  2 VIEW COMPARISON:  145 hours today FINDINGS: Lungs are very under aerated. Aorta remains tortuous. Mild linear atelectasis towards the lung bases. No definite new consolidation. Stable pulmonary vascular crowding. No definite edema. No pleural effusion. No pneumothorax. IMPRESSION: Low lung volumes without evidence of consolidation or pulmonary edema. Electronically Signed   By: Jolaine Click M.D.   On: 01/16/2017 09:03   Dg Abd Acute W/chest  Result Date: 01/16/2017 CLINICAL DATA:  Rectal bleeding and abdominal pain EXAM: DG ABDOMEN ACUTE W/ 1V CHEST COMPARISON:  None. FINDINGS: Cardiomediastinal silhouette is enlarged. There is calcific aortic atherosclerosis. The lungs are clear. There is no free intraperitoneal air. No dilated small bowel is identified. There is stool seen throughout the colon. IMPRESSION: No acute finding of the chest or abdomen. Electronically Signed   By: Deatra Robinson M.D.   On: 01/16/2017 02:13    Micro Results    No results found for this or any previous visit (from the past 240 hour(s)).     Today   Subjective:   Aloni Chuang today has no headache,no chest or  abdominal pain,no new weakness tingling or numbness, had couple of left  lower extremity leg spasm which is chronic during hospital stay, no recurrent of adrenal blood per rectum , she's tolerating her diet .  Objective:   Blood pressure (!) 107/45, pulse 63, temperature 98.9 F (37.2 C), temperature source  Oral, resp. rate 20, height  (1.702 m), weight 121.5 kg (267 lb 13.7 oz), SpO2 96 %.   Intake/Output Summary (Last 24 hours) at 01/16/17 1413 Last data filed at 01/16/17 1100  Gross per 24 hour  Intake              705 ml  Output              600 ml  Net              105 ml    Exam Awake Alert, Oriented x 3 Supple Neck,No JVD, Symmetrical Chest wall movement, Good air movement bilaterally, CTAB RRR,No Gallops,Rubs or new Murmurs, No Parasternal Heave +ve B.Sounds, Abd Soft, Non tender,  No rebound -guarding or rigidity.Normal color stool rectal exam, with external hemorrhoids No Cyanosis, Clubbing or edema, No new Rash or bruise  Data Review   CBC w Diff: Lab Results  Component Value Date   WBC 8.2 01/16/2017   HGB 13.2 01/16/2017   HCT 39.7 01/16/2017   PLT 214 01/16/2017   LYMPHOPCT 28 01/16/2017   MONOPCT 9 01/16/2017   EOSPCT 2 01/16/2017   BASOPCT 1 01/16/2017    CMP: Lab Results  Component Value Date   NA 140 01/16/2017   K 4.2 01/16/2017   CL 103 01/16/2017   CO2 29 01/16/2017   BUN 24 (H) 01/16/2017   CREATININE 1.12 (H) 01/16/2017   PROT 7.7 01/15/2017   ALBUMIN 4.0 01/15/2017   BILITOT 0.9 01/15/2017   ALKPHOS 61 01/15/2017   AST 39 01/15/2017   ALT 32 01/15/2017  .   Total Time in preparing paper work, data evaluation and todays exam - 35 minutes  Marquinn Meschke M.D on 01/16/2017 at 2:13 PM  [facility]   Office  [phone]

## 2017-01-16 NOTE — Progress Notes (Signed)
Patient's O2 drops to 75 to 86% when getting oob to [facility]. Paged MD. New orders placed in EPIC.

## 2017-01-17 LAB — URINE CULTURE

## 2017-10-14 IMAGING — DX DG ABDOMEN ACUTE W/ 1V CHEST
5 series · 5 of 5 positions shown · non-contrast
Comparison: None.

CLINICAL DATA: Rectal bleeding and abdominal pain

EXAM:
DG ABDOMEN ACUTE W/ 1V CHEST

[chest pa]
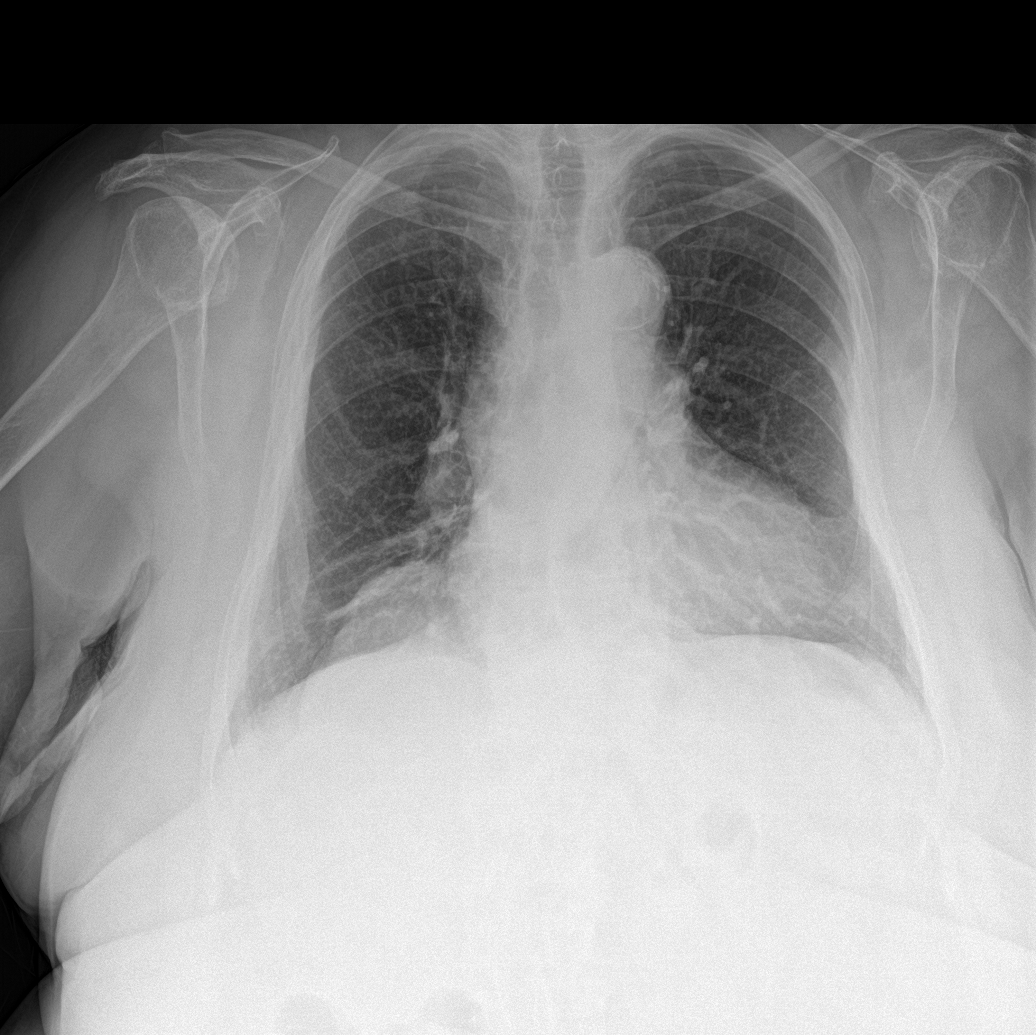

[abdomen erect]
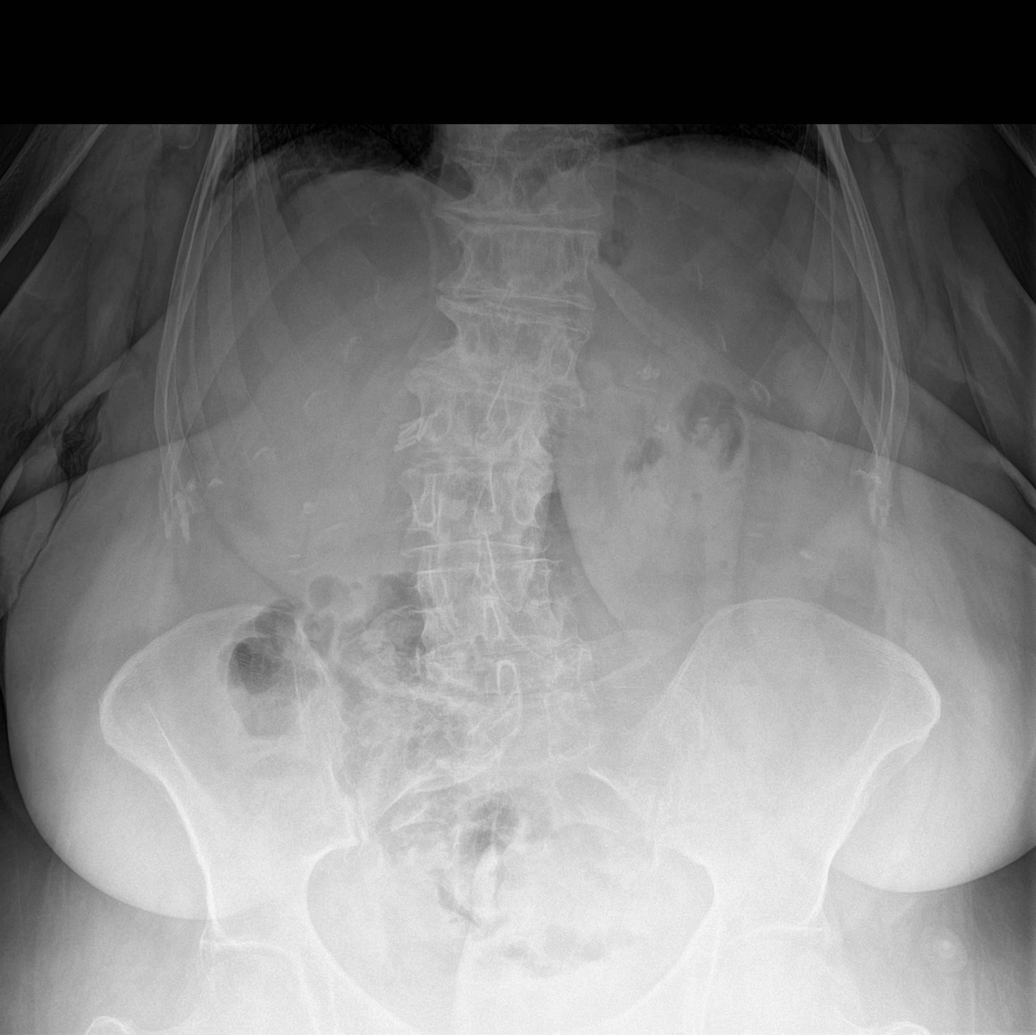

[abdomen supine (1 of 3)]
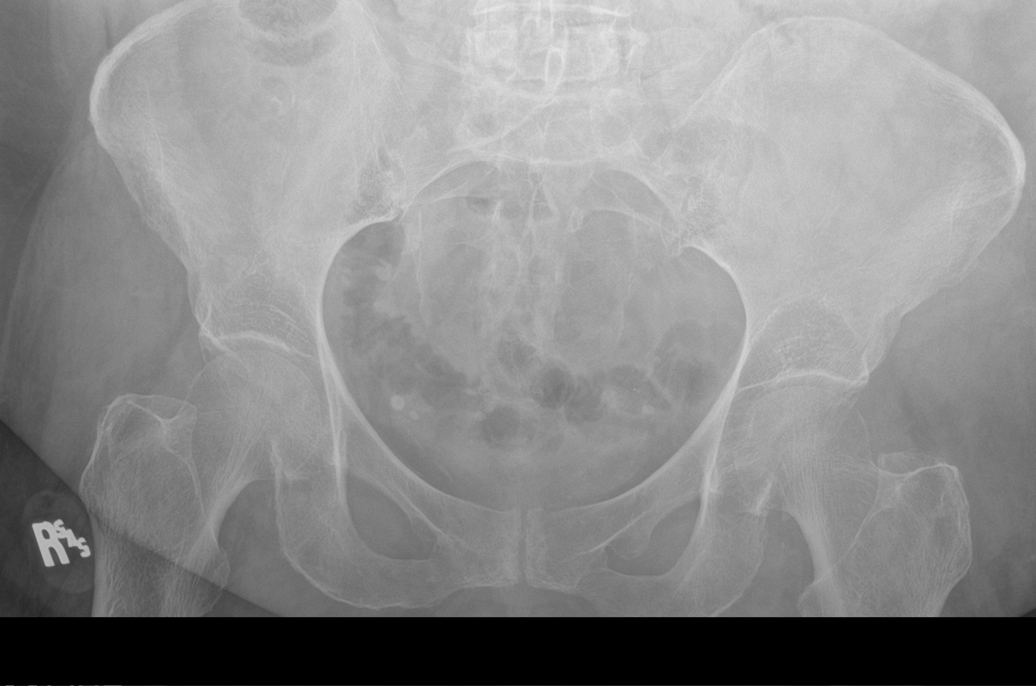

[abdomen supine (2 of 3)]
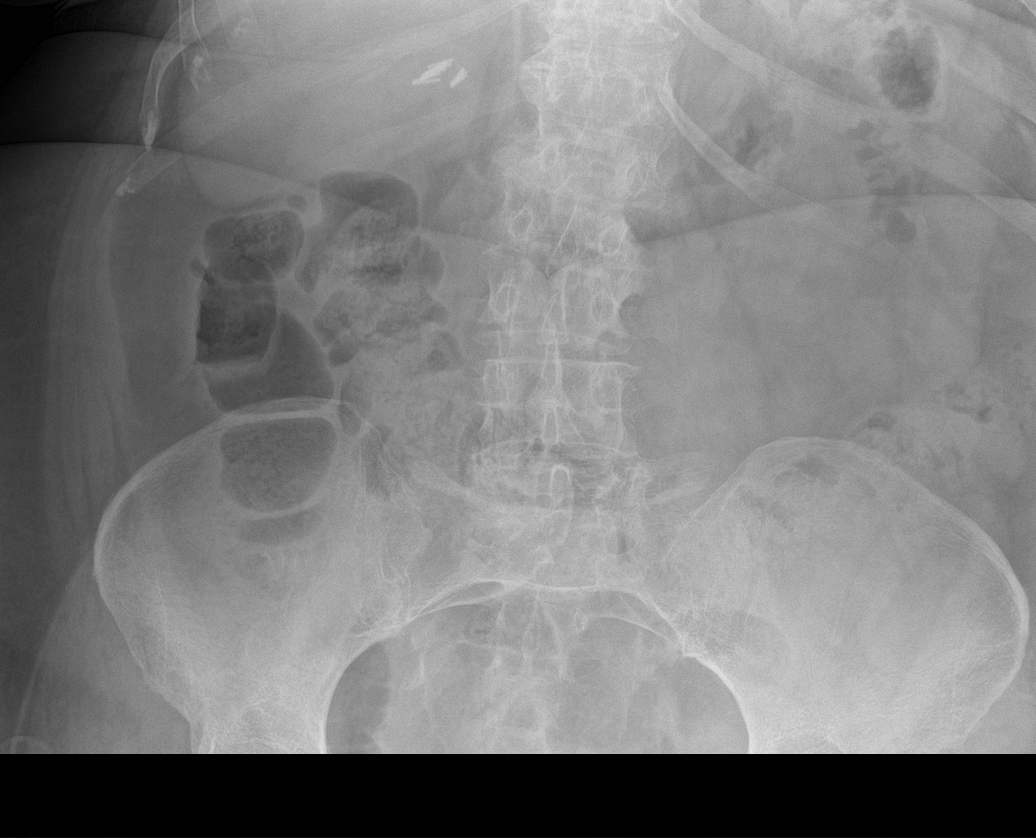

[abdomen supine (3 of 3)]
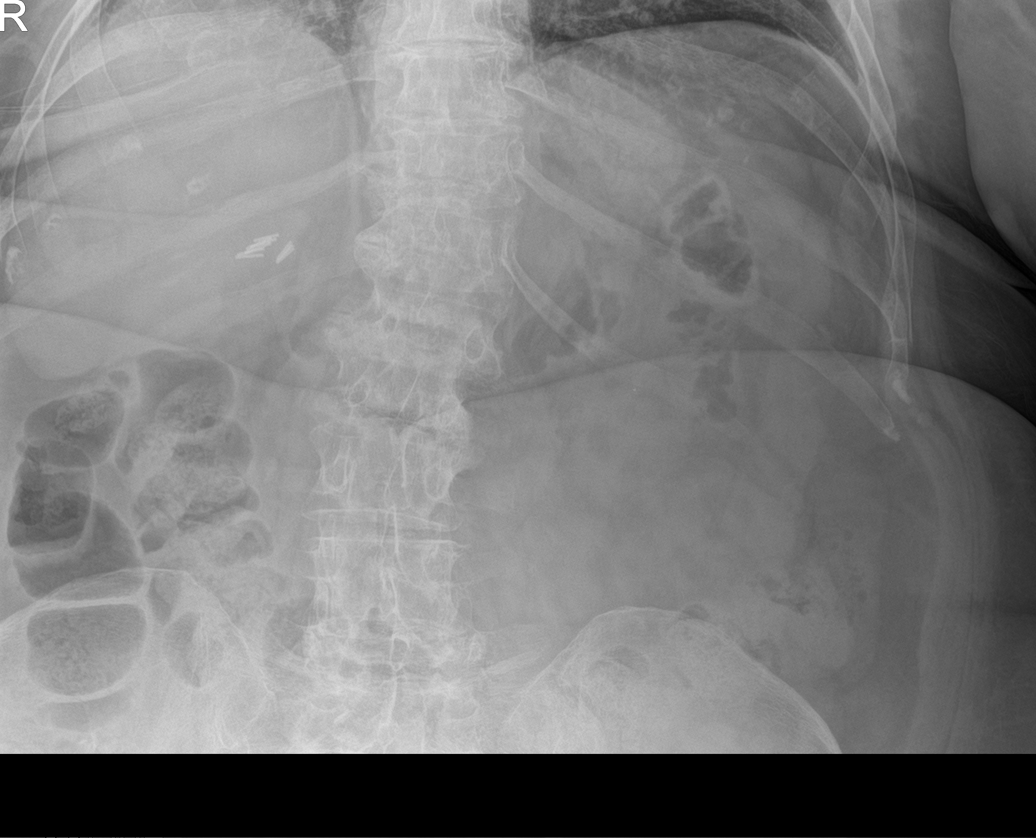

[5 of 5 positions shown; findings below may reference images not displayed]

FINDINGS: Cardiomediastinal silhouette is enlarged. There is calcific aortic
atherosclerosis. The lungs are clear.

There is no free intraperitoneal air. No dilated small bowel is
identified. There is stool seen throughout the colon.
IMPRESSION: No acute finding of the chest or abdomen.

## 2017-10-14 IMAGING — DX DG CHEST 2V
2 series · 3 of 3 positions shown · non-contrast
Comparison: 145 hours today

CLINICAL DATA: Hypoxia

EXAM:
CHEST  2 VIEW

[Series 2: chest lat · 0.14mm/px · 2 of 2 slices shown]
[im 1/2]
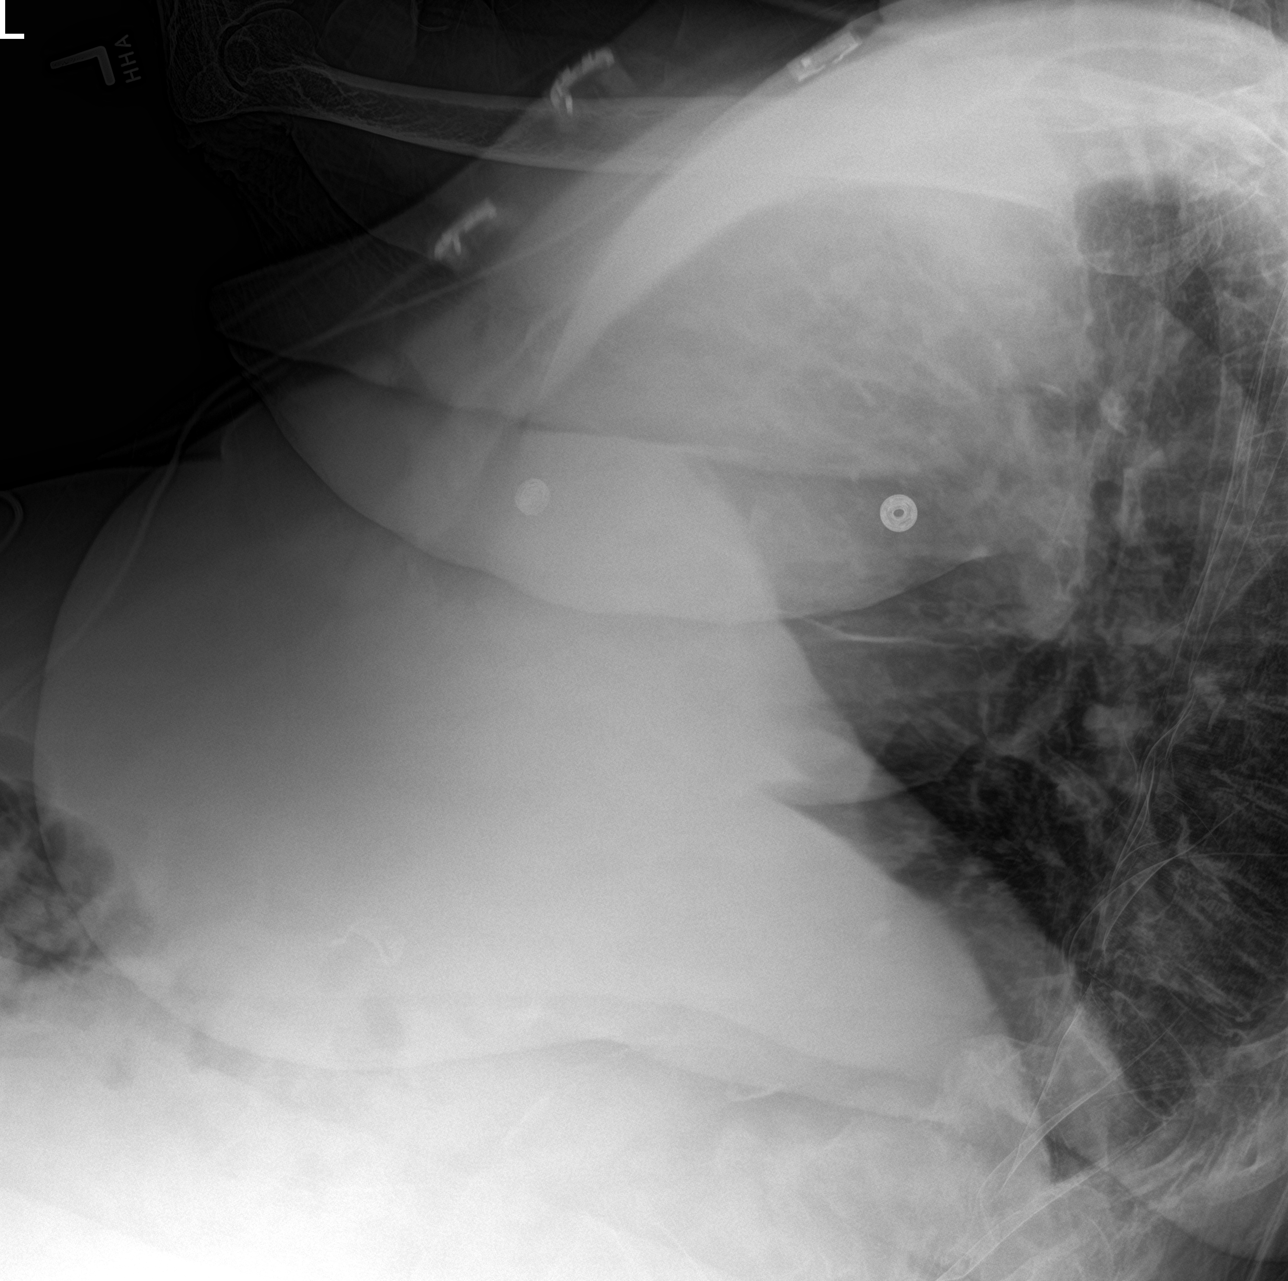
[im 2/2]
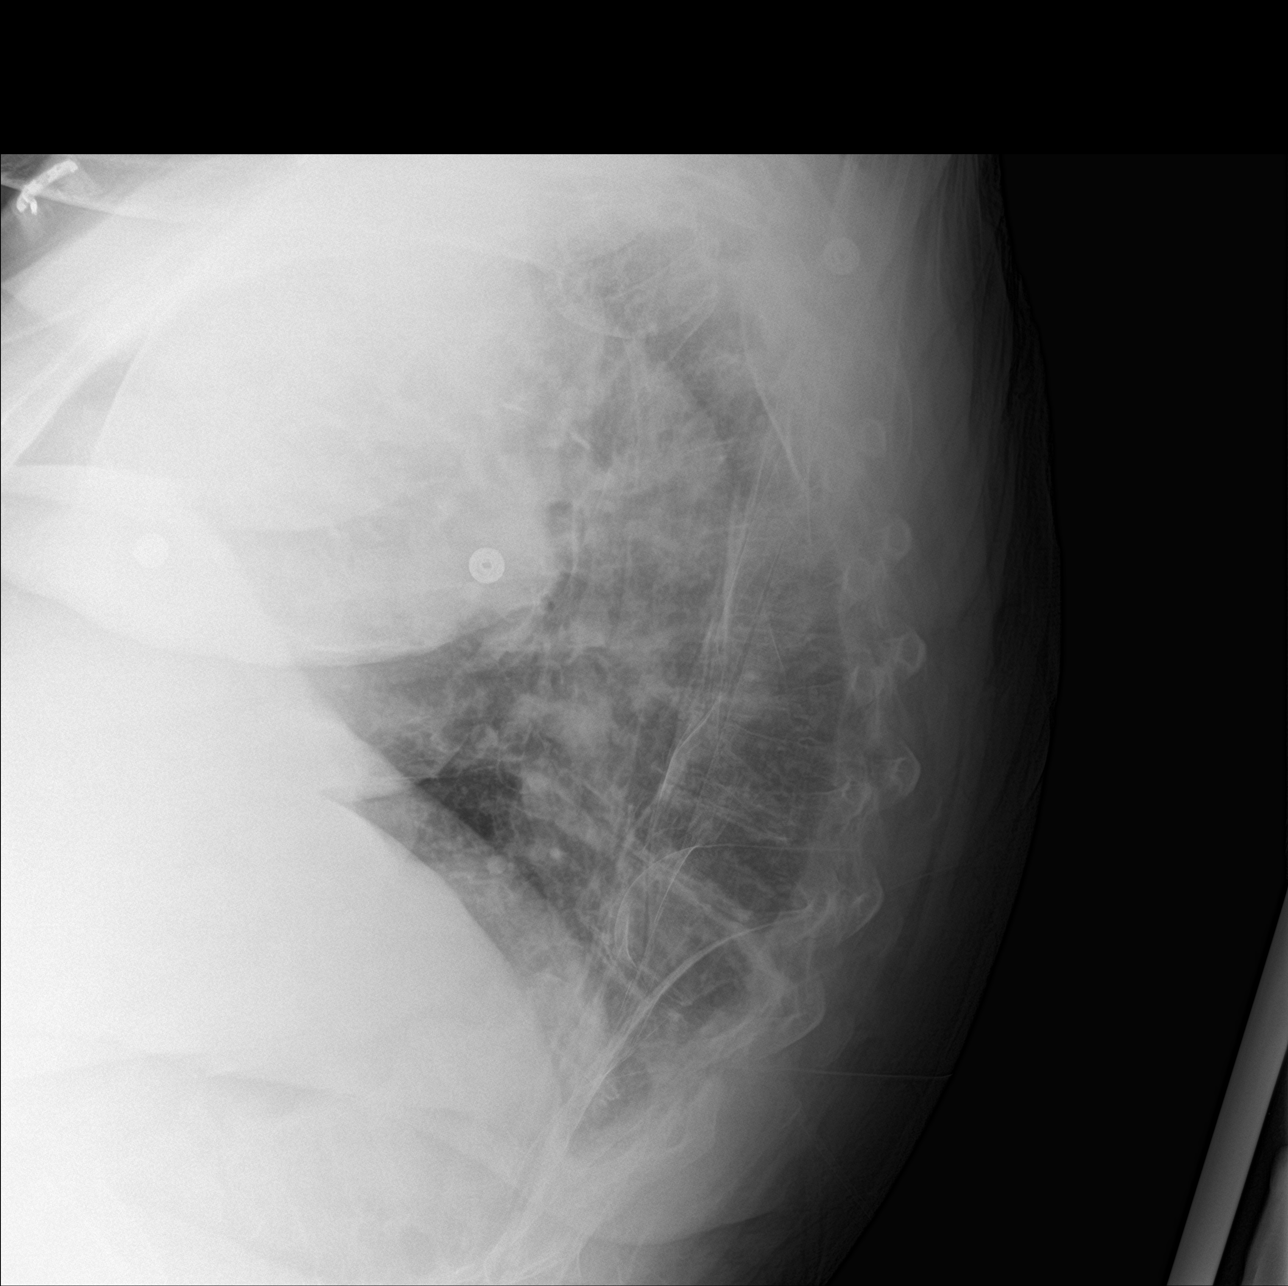

[chest ap]
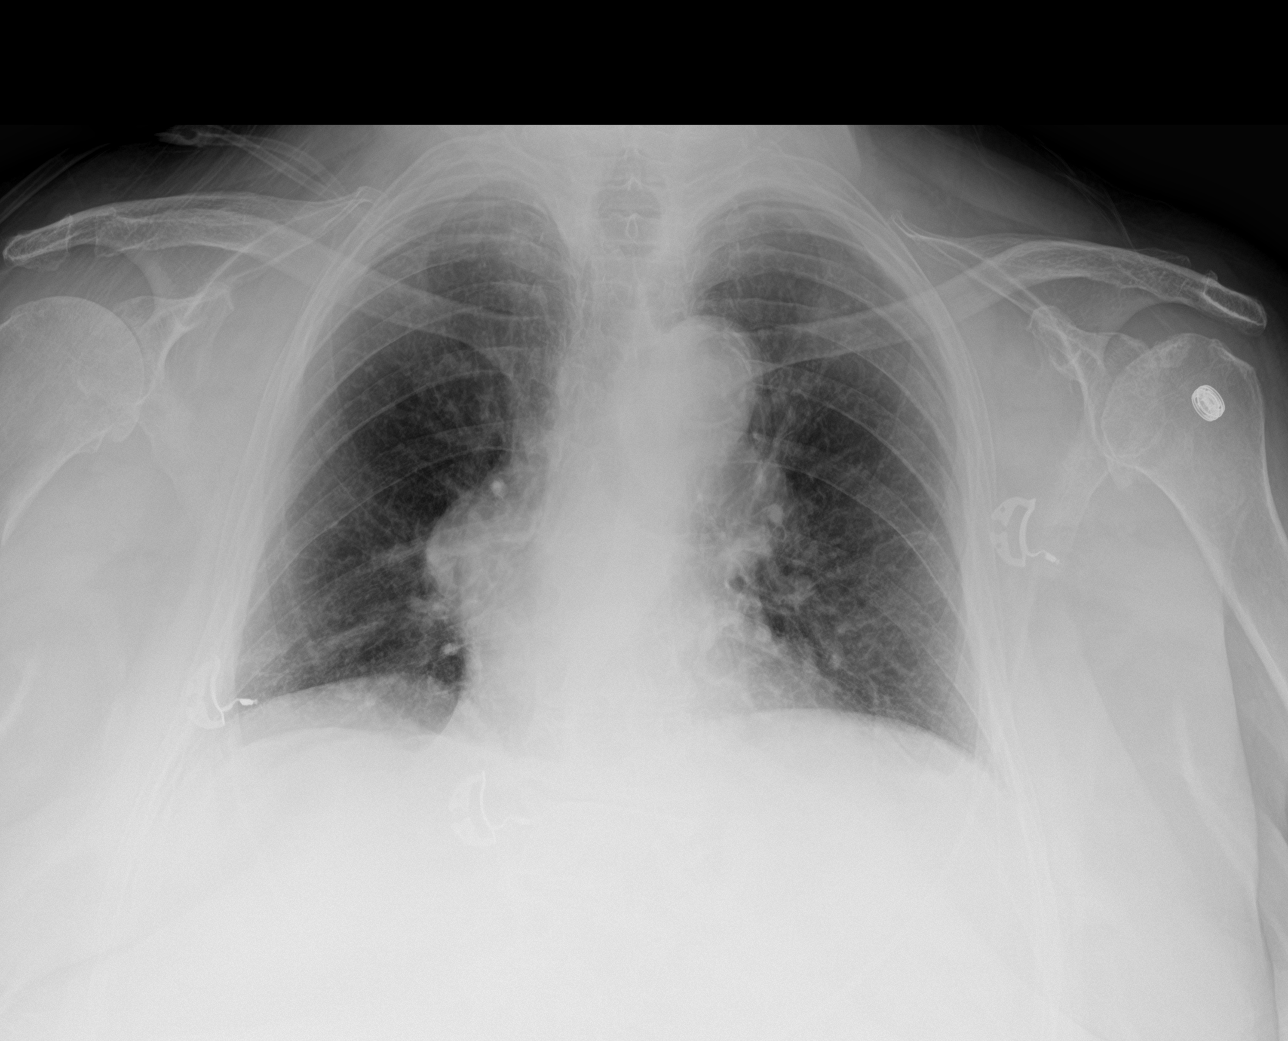

[3 of 3 positions shown; findings below may reference images not displayed]

FINDINGS: Lungs are very under aerated. Aorta remains tortuous. Mild linear
atelectasis towards the lung bases. No definite new consolidation.
Stable pulmonary vascular crowding. No definite edema. No pleural
effusion. No pneumothorax.
IMPRESSION: Low lung volumes without evidence of consolidation or pulmonary
edema.

## 2023-07-06 DEATH — deceased
